# Patient Record
Sex: Male | Born: 1980
Health system: Southern US, Community
[De-identification: ages and names within clinical notes are randomized; demographics above are authoritative.]

## PROBLEM LIST (undated history)

## (undated) DIAGNOSIS — Z789 Other specified health status: Secondary | ICD-10-CM

## (undated) HISTORY — DX: Other specified health status: Z78.9

## (undated) HISTORY — PX: NO PAST SURGERIES: SHX2092

---
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
## 2017-03-26 DIAGNOSIS — Z0000 Encounter for general adult medical examination without abnormal findings: Secondary | ICD-10-CM | POA: Diagnosis not present

## 2017-04-18 DIAGNOSIS — D485 Neoplasm of uncertain behavior of skin: Secondary | ICD-10-CM | POA: Diagnosis not present

## 2017-04-18 DIAGNOSIS — D1801 Hemangioma of skin and subcutaneous tissue: Secondary | ICD-10-CM | POA: Diagnosis not present

## 2017-04-18 DIAGNOSIS — D2261 Melanocytic nevi of right upper limb, including shoulder: Secondary | ICD-10-CM | POA: Diagnosis not present

## 2017-04-18 DIAGNOSIS — B078 Other viral warts: Secondary | ICD-10-CM | POA: Diagnosis not present

## 2017-04-18 DIAGNOSIS — D225 Melanocytic nevi of trunk: Secondary | ICD-10-CM | POA: Diagnosis not present

## 2017-04-18 DIAGNOSIS — D2262 Melanocytic nevi of left upper limb, including shoulder: Secondary | ICD-10-CM | POA: Diagnosis not present

## 2017-05-14 DIAGNOSIS — D485 Neoplasm of uncertain behavior of skin: Secondary | ICD-10-CM | POA: Diagnosis not present

## 2017-05-14 DIAGNOSIS — B078 Other viral warts: Secondary | ICD-10-CM | POA: Diagnosis not present

## 2017-05-14 DIAGNOSIS — L988 Other specified disorders of the skin and subcutaneous tissue: Secondary | ICD-10-CM | POA: Diagnosis not present

## 2018-04-24 DIAGNOSIS — D1801 Hemangioma of skin and subcutaneous tissue: Secondary | ICD-10-CM | POA: Diagnosis not present

## 2018-04-24 DIAGNOSIS — D225 Melanocytic nevi of trunk: Secondary | ICD-10-CM | POA: Diagnosis not present

## 2018-04-24 DIAGNOSIS — D2262 Melanocytic nevi of left upper limb, including shoulder: Secondary | ICD-10-CM | POA: Diagnosis not present

## 2018-04-24 DIAGNOSIS — D2261 Melanocytic nevi of right upper limb, including shoulder: Secondary | ICD-10-CM | POA: Diagnosis not present

## 2018-11-19 DIAGNOSIS — G4700 Insomnia, unspecified: Secondary | ICD-10-CM | POA: Diagnosis not present

## 2018-11-19 DIAGNOSIS — M25561 Pain in right knee: Secondary | ICD-10-CM | POA: Diagnosis not present

## 2018-11-19 DIAGNOSIS — R5383 Other fatigue: Secondary | ICD-10-CM | POA: Diagnosis not present

## 2018-12-15 DIAGNOSIS — R5383 Other fatigue: Secondary | ICD-10-CM | POA: Diagnosis not present

## 2018-12-15 DIAGNOSIS — M25561 Pain in right knee: Secondary | ICD-10-CM | POA: Diagnosis not present

## 2018-12-15 DIAGNOSIS — E559 Vitamin D deficiency, unspecified: Secondary | ICD-10-CM | POA: Diagnosis not present

## 2018-12-15 DIAGNOSIS — R7989 Other specified abnormal findings of blood chemistry: Secondary | ICD-10-CM | POA: Diagnosis not present

## 2018-12-31 ENCOUNTER — Encounter: Payer: Self-pay | Admitting: Sports Medicine

## 2018-12-31 ENCOUNTER — Ambulatory Visit (INDEPENDENT_AMBULATORY_CARE_PROVIDER_SITE_OTHER): Payer: BLUE CROSS/BLUE SHIELD | Admitting: Sports Medicine

## 2018-12-31 ENCOUNTER — Ambulatory Visit (INDEPENDENT_AMBULATORY_CARE_PROVIDER_SITE_OTHER): Payer: BLUE CROSS/BLUE SHIELD

## 2018-12-31 DIAGNOSIS — M25562 Pain in left knee: Secondary | ICD-10-CM

## 2018-12-31 DIAGNOSIS — M2241 Chondromalacia patellae, right knee: Secondary | ICD-10-CM | POA: Diagnosis not present

## 2018-12-31 DIAGNOSIS — M25561 Pain in right knee: Secondary | ICD-10-CM | POA: Diagnosis not present

## 2018-12-31 NOTE — Progress Notes (Signed)
Subjective:    I'm seeing this patient as a consultation for: Dr. 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  He was unable to finish the race.  It tended and seemed to lock up.  Since then he has had persistent discomfort, moderate, persistent, localized on the anterior right knee, and typically gets pain at about 3 miles.  It has been so pervasive he has not really been able to run anymore.  I reviewed the past medical history, family history, social history, surgical history, and allergies today and no changes were needed.  Please see the problem list section below in epic for further details.  Past Medical History: Past Medical History:  Diagnosis Date  . No pertinent past medical history    Past Surgical History: Past Surgical History:  Procedure Laterality Date  . NO PAST SURGERIES     Social History: Social History   Socioeconomic History  . Marital status: Married    Spouse name: Not on file  . Number of children: Not on file  . Years of education: Not on file  . Highest education level: Not on file  Occupational History  . Not on file  Social Needs  . Financial resource strain: Not on file  . Food insecurity:    Worry: Not on file    Inability: Not on file  . Transportation needs:    Medical: Not on file    Non-medical: Not on file  Tobacco Use  . Smoking status: Never Smoker  . Smokeless tobacco: Never Used  Substance and Sexual Activity  . Alcohol use: Yes  . Drug use: Never  . Sexual activity: Yes  Lifestyle  . Physical activity:    Days per week: Not on file    Minutes per session: Not on file  . Stress: Not on file  Relationships  . Social connections:    Talks on phone: Not on file    Gets together: Not on file    Attends religious service: Not on file    Active member of club or  organization: Not on file    Attends meetings of clubs or organizations: Not on file    Relationship status: Not on file  Other Topics Concern  . Not on file  Social History Narrative  . Not on file   Family History: No family history on file. Allergies: No Known Allergies Medications: See med rec.  Review of Systems: No headache, visual changes, nausea, vomiting, diarrhea, constipation, dizziness, abdominal pain, skin rash, fevers, chills, night sweats, weight loss, swollen lymph nodes, body aches, joint swelling, muscle aches, chest pain, shortness of breath, mood changes, visual or auditory hallucinations.   Objective:   General: Well Developed, well nourished, and in no acute distress.  Neuro:  Extra-ocular muscles intact, able to move all 4 extremities, sensation grossly intact.  Deep tendon reflexes tested were normal. Psych: Alert and oriented, mood congruent with affect. ENT:  Ears and nose appear unremarkable.  Hearing grossly normal. Neck: Unremarkable overall appearance, trachea midline.  No visible thyroid enlargement. Eyes: Conjunctivae and lids appear unremarkable.  Pupils equal and round. Skin: Warm and dry, no rashes noted.  Cardiovascular: Pulses palpable, no extremity edema. Right knee: Normal to inspection with no erythema or effusion or obvious bony abnormalities. Palpation normal with no warmth or joint line  tenderness or patellar tenderness or condyle tenderness. ROM normal in flexion and extension and lower leg rotation. Ligaments with solid consistent endpoints including ACL, PCL, LCL, MCL. Negative Mcmurray's and provocative meniscal tests. Painful patellar compression with positive crepitus. Patellar and quadriceps tendons unremarkable. Hamstring and quadriceps strength is normal.  Impression and Recommendations:   This case required medical decision making of moderate complexity.  Chondromalacia of right patellofemoral joint Ultra marathon  runner. Mechanical symptoms, grinding. Suspected focal area of patellofemoral chondral thinning. Symptoms have been present for months in spite of conservative measures. Because he is having mechanical symptoms we are going to proceed with MRI after x-ray. Return to go over MRI results. ___________________________________________ Ihor Austinhomas J. Benjamin Stainhekkekandam, M.D., ABFM., CAQSM. Primary Care and Sports Medicine [facility]  Adjunct Professor of Family Medicine  [employer]

## 2018-12-31 NOTE — Assessment & Plan Note (Addendum)
Ultra marathon runner. Mechanical symptoms, grinding. Suspected focal area of patellofemoral chondral thinning. Symptoms have been present for months in spite of conservative measures. Because he is having mechanical symptoms we are going to proceed with MRI after x-ray. Return to go over MRI results.

## 2019-01-11 ENCOUNTER — Ambulatory Visit (INDEPENDENT_AMBULATORY_CARE_PROVIDER_SITE_OTHER): Payer: BLUE CROSS/BLUE SHIELD

## 2019-01-11 DIAGNOSIS — M2241 Chondromalacia patellae, right knee: Secondary | ICD-10-CM | POA: Diagnosis not present

## 2019-01-11 DIAGNOSIS — M25561 Pain in right knee: Secondary | ICD-10-CM | POA: Diagnosis not present

## 2019-01-14 ENCOUNTER — Ambulatory Visit (INDEPENDENT_AMBULATORY_CARE_PROVIDER_SITE_OTHER): Payer: BLUE CROSS/BLUE SHIELD | Admitting: Sports Medicine

## 2019-01-14 DIAGNOSIS — M2241 Chondromalacia patellae, right knee: Secondary | ICD-10-CM

## 2019-01-14 MED ORDER — MELOXICAM 15 MG PO TABS: ORAL_TABLET | ORAL | 3 refills | Status: AC

## 2019-01-14 NOTE — Progress Notes (Signed)
Subjective:    CC: MRI results  HPI: This is a pleasant 38 year old male, he is an ultra marathon runner, has been having some knee pain.  He is here to go over the results.  I reviewed the past medical history, family history, social history, surgical history, and allergies today and no changes were needed.  Please see the problem list section below in epic for further details.  Past Medical History: Past Medical History:  Diagnosis Date  . No pertinent past medical history    Past Surgical History: Past Surgical History:  Procedure Laterality Date  . NO PAST SURGERIES     Social History: Social History   Socioeconomic History  . Marital status: Married    Spouse name: Not on file  . Number of children: Not on file  . Years of education: Not on file  . Highest education level: Not on file  Occupational History  . Not on file  Social Needs  . Financial resource strain: Not on file  . Food insecurity:    Worry: Not on file    Inability: Not on file  . Transportation needs:    Medical: Not on file    Non-medical: Not on file  Tobacco Use  . Smoking status: Never Smoker  . Smokeless tobacco: Never Used  Substance and Sexual Activity  . Alcohol use: Yes  . Drug use: Never  . Sexual activity: Yes  Lifestyle  . Physical activity:    Days per week: Not on file    Minutes per session: Not on file  . Stress: Not on file  Relationships  . Social connections:    Talks on phone: Not on file    Gets together: Not on file    Attends religious service: Not on file    Active member of club or organization: Not on file    Attends meetings of clubs or organizations: Not on file    Relationship status: Not on file  Other Topics Concern  . Not on file  Social History Narrative  . Not on file   Family History: No family history on file. Allergies: No Known Allergies Medications: See med rec.  Review of Systems: No fevers, chills, night sweats, weight loss, chest  pain, or shortness of breath.   Objective:    General: Well Developed, well nourished, and in no acute distress.  Neuro: Alert and oriented x3, extra-ocular muscles intact, sensation grossly intact.  HEENT: Normocephalic, atraumatic, pupils equal round reactive to light, neck supple, no masses, no lymphadenopathy, thyroid nonpalpable.  Skin: Warm and dry, no rashes. Cardiac: Regular rate and rhythm, no murmurs rubs or gallops, no lower extremity edema.  Respiratory: Clear to auscultation bilaterally. Not using accessory muscles, speaking in full sentences.  MRI personally reviewed with the patient, and shows Hoffa's fat pad edema as well as patellofemoral chondromalacia.  Impression and Recommendations:    Chondromalacia of right patellofemoral joint MRI confirms a superior Hoffa's fat pad edema as well as patellofemoral chondromalacia without focal cartilage defect. We are going to treat this conservatively to start out with, formal physical therapy, meloxicam for 2 weeks, return for custom molded orthotics. If insufficient relief after at least 6 weeks of therapy we will consider Visco supplementation, I am going to go ahead and get him approved even though we may not need to use it. Ultimate goal will be to get him back to marathons and ultra marathons.  I spent 25 minutes with this patient, greater than 50%  was face-to-face time counseling regarding the above diagnoses, specifically discussing the pathophysiology of patellofemoral pain syndrome as well as going over MRI studies. ___________________________________________ Ihor Austin. Benjamin Stain, M.D., ABFM., CAQSM. Primary Care and Sports Medicine [facility] [facility]  Adjunct Professor of Family Medicine  [employer]

## 2019-01-14 NOTE — Assessment & Plan Note (Signed)
MRI confirms a superior Hoffa's fat pad edema as well as patellofemoral chondromalacia without focal cartilage defect. We are going to treat this conservatively to start out with, formal physical therapy, meloxicam for 2 weeks, return for custom molded orthotics. If insufficient relief after at least 6 weeks of therapy we will consider Visco supplementation, I am going to go ahead and get him approved even though we may not need to use it. Ultimate goal will be to get him back to marathons and ultra marathons.

## 2019-01-21 ENCOUNTER — Ambulatory Visit: Payer: BLUE CROSS/BLUE SHIELD | Attending: Sports Medicine | Admitting: Physical Therapy

## 2019-01-21 ENCOUNTER — Encounter: Payer: Self-pay | Admitting: Physical Therapy

## 2019-01-21 ENCOUNTER — Other Ambulatory Visit: Payer: Self-pay

## 2019-01-21 DIAGNOSIS — G8929 Other chronic pain: Secondary | ICD-10-CM | POA: Diagnosis present

## 2019-01-21 DIAGNOSIS — M25561 Pain in right knee: Secondary | ICD-10-CM | POA: Insufficient documentation

## 2019-01-21 DIAGNOSIS — M6281 Muscle weakness (generalized): Secondary | ICD-10-CM | POA: Insufficient documentation

## 2019-01-21 NOTE — Therapy (Signed)
[facility] [address], [address] [place], [place], [address] Phone: [phone]   Fax:  [phone]  Physical Therapy Evaluation  Patient Details  Name: Brent Clark MRN: [id number] Date of Birth: 10/09/1981 Referring Provider (PT): Dr Rodney Langton   Encounter Date: 01/21/2019  PT End of Session - 01/21/19 2013    Visit Number  1    Number of Visits  60    Date for PT Re-Evaluation  03/18/19    Authorization Type  [employer]    PT Start Time  1617    PT Stop Time  1655    PT Time Calculation (min)  38 min    Activity Tolerance  Patient tolerated treatment well       Past Medical History:  Diagnosis Date  . No pertinent past medical history     Past Surgical History:  Procedure Laterality Date  . NO PAST SURGERIES      There were no vitals filed for this visit.   Subjective Assessment - 01/21/19 1620    Subjective   1 year ago training for ultramarathon 7 miles in, knee locked up and couldn't run;  took a few months off;  3-5 mile range running knee will start to hurt or sprinting will hurt or if not warmed up;  lateral knee pain;  worked on ITB with foam roll which helped some.  Knee pain persisted.   I'm not running at all right now b/c of work.  No injections.      Pertinent History  good health    Diagnostic tests  MRI     Patient Stated Goals  Get back to running and not feel limited    Currently in Pain?  No/denies    Pain Score  0-No pain    Pain Location  Knee    Pain Orientation  Right    Pain Type  Chronic pain    Aggravating Factors   running         [facility] PT Assessment - 01/21/19 0001      Assessment   Medical Diagnosis  chondromalacia of right patellofemoral joint    Referring Provider (PT)  Dr Rodney Langton    Onset Date/Surgical Date  --   1  year   Next MD Visit  1 month if not better then PRP injection     Prior Therapy  no      Precautions   Precautions  None      Restrictions   Weight Bearing Restrictions  No      Balance Screen   Has the patient fallen in the past 6 months  No    Has the patient had a decrease in activity level because of a fear of falling?   No    Is the patient reluctant to leave their home because of a fear of falling?   No      Home Public house manager residence    Living Arrangements  Spouse/significant other      Prior Function   Level of Independence  Independent    Vocation  Full time employment    Leisure  ultra marathon runner;  works with Systems analyst      Observation/Other Assessments   Focus on Therapeutic Outcomes (FOTO)   to be done next visit      AROM   Right Knee Extension  0    Right Knee Flexion  140  Left Knee Extension  0    Left Knee Flexion  140      Strength   Overall Strength Comments  difficulty stabilizing/pelvic drop with right SLS;  right adduction/internal rotation with step downs     Right Hip Flexion  5/5    Right Hip Extension  5/5    Right Hip ABduction  4+/5    Left Hip Flexion  5/5    Left Hip Extension  5/5    Left Hip ABduction  5/5    Right Knee Flexion  5/5    Right Knee Extension  4+/5    Left Knee Flexion  5/5    Left Knee Extension  5/5      Flexibility   Soft Tissue Assessment /Muscle Length  yes    Hamstrings  bil 70 degrees    Quadriceps  mild right hip flexor tightness         Tender points right ITB and lateral quads       Objective measurements completed on examination: See above findings.              PT Education - 01/21/19 1654    Education Details   Access Code: [email or web address]  clams with green band; standing stand tall;  3 inch step downs     Person(s) Educated  Patient    Methods  Explanation;Demonstration;Handout    Comprehension  Returned demonstration;Verbalized understanding       PT Short Term Goals - 01/21/19 2024      PT SHORT TERM GOAL #1   Title  Patient will demonstrate awareness of  patellofemoral alignment during exercises and ADLs    Time  4    Period  Weeks    Status  New    Target Date  02/18/19      PT SHORT TERM GOAL #2   Title  The patient will report a 50% reduction in lateral knee pain with running 3 miles    Time  4    Period  Weeks    Status  New        PT Long Term Goals - 01/21/19 2026      PT LONG TERM GOAL #1   Title  The patient will be independent in safe self progression of HEP     Time  8    Period  Weeks    Status  New    Target Date  03/18/19      PT LONG TERM GOAL #2   Title  The patient will report a 60% improvement in right lateral knee pain with running > 5 miles    Time  8    Period  Weeks    Status  New      PT LONG TERM GOAL #3   Title  The patient will have 5-/5 gluteal and quad strength needed for patellofemoral alignment with running    Time  8    Period  Weeks    Status  New      PT LONG TERM GOAL #4   Title  FOTO functional outcome score improved to predicted score     Time  8    Period  Weeks    Status  New             Plan - 01/21/19 1658    Clinical Impression Statement  The patient complains of a 1 year history of right lateral knee pain while training for an ultramarathon.  He  reports in his initial incident his knee locked up and he was unable to continue.  He pain continues to occur at the 3-5 mile mark during his runs despite taking off several months off.  He had an MRI shows mild chondromalacia of the patella, mild fat pad edema.  No meniscal or ligamentous injury of right knee.  His knee ROM is WNLs.  Decreased bil HS lengths and mild right hip flexor length.  Tender points lateral knee at lateral quad insertion site and along ITB.  Right gluteal weakness apparent with SLS and hip/knee adduction and internal rotation (medial knee collapse) noted with 6 inch step down test.  He would benefit from PT to address these deficits.      History and Personal Factors relevant to plan of care:  good health;   good psychosocial support    Clinical Presentation  Stable    Clinical Decision Making  Low    Rehab Potential  Good    PT Frequency  1x / week    PT Duration  8 weeks    PT Treatment/Interventions  ADLs/Self Care Home Management;Iontophoresis 4mg /ml Dexamethasone;Cryotherapy;Electrical Stimulation;Ultrasound;Moist Heat;Therapeutic exercise;Patient/family education;Neuromuscular re-education;Manual techniques;Dry needling;Taping    PT Next Visit Plan  Do FOTO missed on eval;  Add HS stretch to HEP;  check Thomas test;  check plank/core strength;  add band to sidestepping;  recheck step downs;  very interested DN ITB/lateral quads;  teach self deep knee retinaculum stretch;  ionto as a last resort     PT Home Exercise Plan   Access Code: [email or web address]     Consulted and Agree with Plan of Care  Patient       Patient will benefit from skilled therapeutic intervention in order to improve the following deficits and impairments:  Pain, Decreased strength, Impaired perceived functional ability  Visit Diagnosis: Chronic pain of right knee - Plan: PT plan of care cert/re-cert  Muscle weakness (generalized) - Plan: PT plan of care cert/re-cert     Problem List Patient Active Problem List   Diagnosis Date Noted  . Chondromalacia of right patellofemoral joint 12/31/2018   [name], PT 01/21/19 8:32 PM Phone: [phone] Fax: [phone] [name] 01/21/2019, 8:31 PM  [facility] [address], [address] [place], [place], [address] Phone: [phone]   Fax:  [phone]  Name: Brent Clark MRN: [id number] Date of Birth: [birth year]

## 2019-01-21 NOTE — Patient Instructions (Signed)
Access Code: [email or web address]  URL: [email or web address]  Date: 01/21/2019  Prepared by: [name]   Exercises  Clamshell with Resistance - 10 reps - 2-3 sets - 1x daily - 7x weekly  Standing Gluteal Sets - 10 reps - 3 sets - 1x daily - 7x weekly  Forward Step Down - 10 reps - 1-2 sets - 1x daily - 7x weekly

## 2019-01-27 ENCOUNTER — Ambulatory Visit (INDEPENDENT_AMBULATORY_CARE_PROVIDER_SITE_OTHER): Payer: BLUE CROSS/BLUE SHIELD | Admitting: Sports Medicine

## 2019-01-27 DIAGNOSIS — M2241 Chondromalacia patellae, right knee: Secondary | ICD-10-CM | POA: Diagnosis not present

## 2019-01-27 NOTE — Progress Notes (Signed)
    Patient was fitted for a : standard, cushioned, semi-rigid orthotic. The orthotic was heated and afterward the patient stood on the orthotic blank positioned on the orthotic stand. The patient was positioned in subtalar neutral position and 10 degrees of ankle dorsiflexion in a weight bearing stance. After completion of molding, a stable base was applied to the orthotic blank. The blank was ground to a stable position for weight bearing. Size: 10 Base: White Doctor, hospital and Padding: None The patient ambulated these, and they were very comfortable.  I spent 40 minutes with this patient, greater than 50% was face-to-face time counseling regarding the below diagnosis specifically diagnosis, treatment options and prognosis of patellofemoral chondromalacia..  ___________________________________________ Ihor Austin. Benjamin Stain, M.D., ABFM., CAQSM. Primary Care and Sports Medicine [place] [facility]  Adjunct Instructor of Family Medicine  [employer]

## 2019-01-27 NOTE — Assessment & Plan Note (Signed)
MRI confirms Hoffa's fat pad edema as well as patellofemoral chondromalacia without focal cartilage defect. Continue with formal PT, custom molded orthotics today. If insufficient relief after 6 weeks we will consider Visco supplementation. We are going to get him approved. Ultimate goal is to get back to marathons and ultra marathons.

## 2019-01-29 ENCOUNTER — Ambulatory Visit: Payer: BLUE CROSS/BLUE SHIELD | Admitting: Physical Therapy

## 2019-01-29 DIAGNOSIS — M6281 Muscle weakness (generalized): Secondary | ICD-10-CM

## 2019-01-29 DIAGNOSIS — M25561 Pain in right knee: Principal | ICD-10-CM

## 2019-01-29 DIAGNOSIS — G8929 Other chronic pain: Secondary | ICD-10-CM

## 2019-01-29 NOTE — Patient Instructions (Signed)
Access Code: [email or web address]  URL: [email or web address]  Date: 01/29/2019  Prepared by: [name]   Exercises  Clamshell with Resistance - 10 reps - 2-3 sets - 1x daily - 7x weekly  Standing Gluteal Sets - 10 reps - 3 sets - 1x daily - 7x weekly  Forward Step Down - 10 reps - 1-2 sets - 1x daily - 7x weekly  Long Sitting Medial Patellar Glide - 10 reps - 1 sets - 1x daily - 7x weekly  Standing ITB Stretch - 3 reps - 1 sets - 20 hold - 1x daily - 7x weekly  Supine Hamstring Stretch with Strap - 3 reps - 1 sets - 20 hold - 1x daily - 7x weekly  Side Stepping with Resistance at Thighs - 10 reps - 1 sets - 1x daily - 7x weekly  Erby Pian on Table - 3 reps - 1 sets - 20 hold - 1x daily - 7x weekly     Trigger Point Dry Needling  . What is Trigger Point Dry Needling (DN)? o DN is a physical therapy technique used to treat muscle pain and dysfunction. Specifically, DN helps deactivate muscle trigger points (muscle knots).  o A thin filiform needle is used to penetrate the skin and stimulate the underlying trigger point. The goal is for a local twitch response (LTR) to occur and for the trigger point to relax. No medication of any kind is injected during the procedure.   . What Does Trigger Point Dry Needling Feel Like?  o The procedure feels different for each individual patient. Some patients report that they do not actually feel the needle enter the skin and overall the process is not painful. Very mild bleeding may occur. However, many patients feel a deep cramping in the muscle in which the needle was inserted. This is the local twitch response.   Marland Kitchen How Will I feel after the treatment? o Soreness is normal, and the onset of soreness may not occur for a few hours. Typically this soreness does not last longer than two days.  o Bruising is uncommon, however; ice can be used to decrease any possible bruising.  o In rare cases feeling tired or nauseous after the treatment is  normal. In addition, your symptoms may get worse before they get better, this period will typically not last longer than 24 hours.   . What Can I do After My Treatment? o Increase your hydration by drinking more water for the next 24 hours. o You may place ice or heat on the areas treated that have become sore, however, do not use heat on inflamed or bruised areas. Heat often brings more relief post needling. o You can continue your regular activities, but vigorous activity is not recommended initially after the treatment for 24 hours. o DN is best combined with other physical therapy such as strengthening, stretching, and other therapies.     [name] PT [facility] [address], [address] [place], [place] [address] Phone # [phone] Fax [phone]

## 2019-01-29 NOTE — Therapy (Signed)
[facility] [address] [place], [place], [address] Phone: [phone]   Fax:  [phone]  Physical Therapy Treatment  Patient Details  Name: Brent Clark MRN: [id number] Date of Birth: 28-Aug-1981 Referring Provider (PT): Dr Rodney Langton   Encounter Date: 01/29/2019  PT End of Session - 01/29/19 1644    Visit Number  2    Number of Visits  60    Date for PT Re-Evaluation  03/18/19    Authorization Type  BCBS    PT Start Time  0845    PT Stop Time  0930    PT Time Calculation (min)  45 min    Activity Tolerance  Patient tolerated treatment well       Past Medical History:  Diagnosis Date  . No pertinent past medical history     Past Surgical History:  Procedure Laterality Date  . NO PAST SURGERIES      There were no vitals filed for this visit.  Subjective Assessment - 01/29/19 0852    Subjective  Got custom orthotics yesterday.  No pain this week.  I haven't run b/c really busy at work right now.      Currently in Pain?  No/denies    Pain Score  0-No pain                       [facility] Adult PT Treatment/Exercise - 01/29/19 0001      Knee/Hip Exercises: Stretches   Active Hamstring Stretch  Right;Left;3 reps;20 seconds    Active Hamstring Stretch Limitations  with strap in supine     Hip Flexor Stretch  Right;2 reps;20 seconds    Hip Flexor Stretch Limitations  Thomas test position     ITB Stretch  Right;3 reps;20 seconds    ITB Stretch Limitations  standing      Knee/Hip Exercises: Aerobic   Nustep  L3 4 min       Knee/Hip Exercises: Standing   Lateral Step Up Limitations  lateral 3 point step downs 15x    Step Down  Left;15 reps;Hand Hold: 0;Step Height: 4"    Other Standing Knee Exercises  4 inch step forward and then back over the step 15x     Other Standing Knee Exercises  green band around thighs crouch side step, monster walk and tight rope walk       Knee/Hip Exercises: Sidelying   Clams  green band and with ball between feet 15x      Moist Heat Therapy   Number Minutes Moist Heat  5 Minutes    Moist Heat Location  Knee      Manual Therapy   Manual therapy comments  deep knee retinaculum stretch 7x    Soft tissue mobilization  right ITB, lateral HS and quads manually and with Addaday instrument assist        Trigger Point Dry Needling - 01/29/19 1641    Consent Given?  Yes    Education Handout Provided  Yes    Muscles Treated Lower Body  Tensor fascia lata;Hamstring    Tensor Fascia Lata Response  Twitch response elicited;Palpable increased muscle length    Hamstring Response  Twitch response elicited;Palpable increased muscle length           PT Education - 01/29/19 1644    Education Details   Access Code: [email or web address]     Person(s) Educated  Patient    Methods  Explanation;Demonstration;Handout  Comprehension  Returned demonstration;Verbalized understanding       PT Short Term Goals - 01/21/19 2024      PT SHORT TERM GOAL #1   Title  Patient will demonstrate awareness of patellofemoral alignment during exercises and ADLs    Time  4    Period  Weeks    Status  New    Target Date  02/18/19      PT SHORT TERM GOAL #2   Title  The patient will report a 50% reduction in lateral knee pain with running 3 miles    Time  4    Period  Weeks    Status  New        PT Long Term Goals - 01/21/19 2026      PT LONG TERM GOAL #1   Title  The patient will be independent in safe self progression of HEP     Time  8    Period  Weeks    Status  New    Target Date  03/18/19      PT LONG TERM GOAL #2   Title  The patient will report a 60% improvement in right lateral knee pain with running > 5 miles    Time  8    Period  Weeks    Status  New      PT LONG TERM GOAL #3   Title  The patient will have 5-/5 gluteal and quad strength needed for patellofemoral alignment with running    Time  8    Period  Weeks    Status   New      PT LONG TERM GOAL #4   Title  FOTO functional outcome score improved to predicted score     Time  8    Period  Weeks    Status  New            Plan - 01/29/19 0930    Clinical Impression Statement  The patient has improved motor control with 4 inch step downs.  He does report muscular fatigue in quads and gluteals with exercise.  Tender points along lateral HS attachment at the knee.  Patient is very receptive to DN.  Therapist closely monitoring response with all interventions.      Rehab Potential  Good    PT Frequency  1x / week    PT Duration  8 weeks    PT Treatment/Interventions  ADLs/Self Care Home Management;Iontophoresis 4mg /ml Dexamethasone;Cryotherapy;Electrical Stimulation;Ultrasound;Moist Heat;Therapeutic exercise;Patient/family education;Neuromuscular re-education;Manual techniques;Dry needling;Taping    PT Next Visit Plan   check plank/core strength; progressive quad and gluteal strengthening;  assess response to  DN ITB/lateral quads;   Elliptical;   ionto as a last resort     PT Home Exercise Plan   Access Code: [email or web address]        Patient will benefit from skilled therapeutic intervention in order to improve the following deficits and impairments:  Pain, Decreased strength, Impaired perceived functional ability  Visit Diagnosis: Chronic pain of right knee  Muscle weakness (generalized)     Problem List Patient Active Problem List   Diagnosis Date Noted  . Chondromalacia of right patellofemoral joint 12/31/2018   [name], PT 01/29/19 4:54 PM Phone: [phone] Fax: [phone]  [name] 01/29/2019, 4:54 PM  [facility] [address], [address] [place], [place], [address] Phone: [phone]   Fax:  [phone]  Name: Brent Clark MRN: [id number] Date of Birth: 1981-12-26

## 2019-02-02 ENCOUNTER — Encounter: Payer: Self-pay | Admitting: Physical Therapy

## 2019-02-02 ENCOUNTER — Ambulatory Visit: Payer: BLUE CROSS/BLUE SHIELD | Attending: Sports Medicine | Admitting: Physical Therapy

## 2019-02-02 DIAGNOSIS — M25561 Pain in right knee: Secondary | ICD-10-CM | POA: Diagnosis not present

## 2019-02-02 DIAGNOSIS — M6281 Muscle weakness (generalized): Secondary | ICD-10-CM | POA: Diagnosis present

## 2019-02-02 DIAGNOSIS — G8929 Other chronic pain: Secondary | ICD-10-CM | POA: Diagnosis present

## 2019-02-02 NOTE — Therapy (Signed)
[facility] [address] [place], [place], [address] Phone: [phone]   Fax:  [phone]  Physical Therapy Treatment  Patient Details  Name: Brent Clark MRN: [id number] Date of Birth: 1981-10-11 Referring Provider (PT): Dr Rodney Langton   Encounter Date: 02/02/2019  PT End of Session - 02/02/19 1659    Visit Number  3    Number of Visits  60    Date for PT Re-Evaluation  03/18/19    Authorization Type  BCBS    PT Start Time  1533    PT Stop Time  1611    PT Time Calculation (min)  38 min    Activity Tolerance  Patient tolerated treatment well       Past Medical History:  Diagnosis Date  . No pertinent past medical history     Past Surgical History:  Procedure Laterality Date  . NO PAST SURGERIES      There were no vitals filed for this visit.  Subjective Assessment - 02/02/19 1533    Subjective  A little sore Saturday but better by Sunday.  Haven't run b/c of work.      Currently in Pain?  No/denies    Pain Score  0-No pain    Pain Location  Knee    Pain Orientation  Right                       [facility] Adult PT Treatment/Exercise - 02/02/19 0001      Knee/Hip Exercises: Stretches   Active Hamstring Stretch  Right;5 reps    Active Hamstring Stretch Limitations  on 2nd step with bias internal rotation     ITB Stretch  Right;3 reps;20 seconds    ITB Stretch Limitations  standing on 2nd step with overhead reach       Knee/Hip Exercises: Aerobic   Elliptical  L4 5 min       Knee/Hip Exercises: Machines for Strengthening   Cybex Leg Press  90# and 110# 50 reps right leg only       Knee/Hip Exercises: Standing   Lateral Step Up  Right;15 reps;Step Height: 6"    Lateral Step Up Limitations  with opposite leg hip abduction     Forward Step Up  Right;Hand Hold: 0;Step Height: 6"    Forward Step Up Limitations  left foot touch to chair     Step Down  Left;15 reps;Hand  Hold: 0;Step Height: 6"    Lunge Walking - Round Trips  2 laps    SLS  on right with blue band     SLS with Vectors  red band on left 4 ways 15x each right/left     Other Standing Knee Exercises  windmills right/left 15x each     Other Standing Knee Exercises  dynamic warm ups: high step, SLR walk, butt kicks, heel walk, toe walk, skipping               PT Short Term Goals - 01/21/19 2024      PT SHORT TERM GOAL #1   Title  Patient will demonstrate awareness of patellofemoral alignment during exercises and ADLs    Time  4    Period  Weeks    Status  New    Target Date  02/18/19      PT SHORT TERM GOAL #2   Title  The patient will report a 50% reduction in lateral knee pain with running  3 miles    Time  4    Period  Weeks    Status  New        PT Long Term Goals - 01/21/19 2026      PT LONG TERM GOAL #1   Title  The patient will be independent in safe self progression of HEP     Time  8    Period  Weeks    Status  New    Target Date  03/18/19      PT LONG TERM GOAL #2   Title  The patient will report a 60% improvement in right lateral knee pain with running > 5 miles    Time  8    Period  Weeks    Status  New      PT LONG TERM GOAL #3   Title  The patient will have 5-/5 gluteal and quad strength needed for patellofemoral alignment with running    Time  8    Period  Weeks    Status  New      PT LONG TERM GOAL #4   Title  FOTO functional outcome score improved to predicted score     Time  8    Period  Weeks    Status  New            Plan - 02/02/19 1700    Clinical Impression Statement  The patient has improving right patellofemoral motor control with less adduction/internal rotation.  He is able to progress intensity of quad and gluteal strengthening without medial knee collapse.  Therapist closely monitoring response with all.  No pain produced.  Discussed slow return to running program.      Rehab Potential  Good    PT Frequency  1x / week     PT Duration  8 weeks    PT Treatment/Interventions  ADLs/Self Care Home Management;Iontophoresis 4mg /ml Dexamethasone;Cryotherapy;Electrical Stimulation;Ultrasound;Moist Heat;Therapeutic exercise;Patient/family education;Neuromuscular re-education;Manual techniques;Dry needling;Taping    PT Next Visit Plan   check plank/core strength; progressive quad and gluteal strengthening;    DN ITB/lateral quads;   Elliptical;   ionto as a last resort     PT Home Exercise Plan   Access Code: [email or web address]        Patient will benefit from skilled therapeutic intervention in order to improve the following deficits and impairments:  Pain, Decreased strength, Impaired perceived functional ability  Visit Diagnosis: Chronic pain of right knee  Muscle weakness (generalized)     Problem List Patient Active Problem List   Diagnosis Date Noted  . Chondromalacia of right patellofemoral joint 12/31/2018   Brent Clark, PT 02/02/19 5:03 PM Phone: [phone] Fax: [phone] Brent Clark 02/02/2019, 5:03 PM  [facility] [address], [address] [place], [place], [address] Phone: [phone]   Fax:  [phone]  Name: Brent Clark MRN: [id number] Date of Birth: August 06, 1981

## 2019-02-09 ENCOUNTER — Encounter: Payer: Self-pay | Admitting: Physical Therapy

## 2019-02-09 ENCOUNTER — Ambulatory Visit: Payer: BLUE CROSS/BLUE SHIELD | Admitting: Physical Therapy

## 2019-02-09 DIAGNOSIS — M25561 Pain in right knee: Principal | ICD-10-CM

## 2019-02-09 DIAGNOSIS — G8929 Other chronic pain: Secondary | ICD-10-CM | POA: Diagnosis not present

## 2019-02-09 DIAGNOSIS — M6281 Muscle weakness (generalized): Secondary | ICD-10-CM

## 2019-02-09 NOTE — Therapy (Signed)
[name] [address], [address] [place], [place], [address] Phone: [phone]   Fax:  [phone]  Physical Therapy Treatment  Patient Details  Name: Brent Clark MRN: [id number] Date of Birth: 03/06/1981 Referring Provider (PT): Dr Rodney Langton   Encounter Date: 02/09/2019  PT End of Session - 02/09/19 1723    Visit Number  4    Number of Visits  60    Date for PT Re-Evaluation  03/18/19    Authorization Type  BCBS    PT Start Time  1620    PT Stop Time  1700    PT Time Calculation (min)  40 min    Activity Tolerance  Patient tolerated treatment well       Past Medical History:  Diagnosis Date  . No pertinent past medical history     Past Surgical History:  Procedure Laterality Date  . NO PAST SURGERIES      There were no vitals filed for this visit.  Subjective Assessment - 02/09/19 1623    Subjective  Did 2 1/2 miles and it felt really strong.  I was aware of it but it was fun.      Currently in Pain?  No/denies    Pain Score  0-No pain    Pain Type  Chronic pain                       [facility] Adult PT Treatment/Exercise - 02/09/19 0001      Knee/Hip Exercises: Stretches   Active Hamstring Stretch  Right;5 reps    Active Hamstring Stretch Limitations  seated      Knee/Hip Exercises: Aerobic   Elliptical  L4 7 min       Knee/Hip Exercises: Standing   Lateral Step Up  Right;15 reps;Step Height: 6"    Lateral Step Up Limitations  with opposite leg hip abduction     Forward Step Up  Right;Hand Hold: 0;Step Height: 6"    Forward Step Up Limitations  toe touch to chair     SLS  with 4# plyo ball toss against rebounder     SLS with Vectors  8# weight pass hand to hand    Other Standing Knee Exercises  blue band side stepping    Other Standing Knee Exercises  bottom of foot against wall with green band clams 15x right/left       Knee/Hip Exercises: Supine   Single Leg  Bridge  Strengthening;Right;Left;15 reps      Knee/Hip Exercises: Sidelying   Other Sidelying Knee/Hip Exercises  side planks with top leg hip abduction 15x right/left    Other Sidelying Knee/Hip Exercises  side planks knees down with clam 15x right/left               PT Short Term Goals - 01/21/19 2024      PT SHORT TERM GOAL #1   Title  Patient will demonstrate awareness of patellofemoral alignment during exercises and ADLs    Time  4    Period  Weeks    Status  New    Target Date  02/18/19      PT SHORT TERM GOAL #2   Title  The patient will report a 50% reduction in lateral knee pain with running 3 miles    Time  4    Period  Weeks    Status  New        PT Long Term  Goals - 01/21/19 2026      PT LONG TERM GOAL #1   Title  The patient will be independent in safe self progression of HEP     Time  8    Period  Weeks    Status  New    Target Date  03/18/19      PT LONG TERM GOAL #2   Title  The patient will report a 60% improvement in right lateral knee pain with running > 5 miles    Time  8    Period  Weeks    Status  New      PT LONG TERM GOAL #3   Title  The patient will have 5-/5 gluteal and quad strength needed for patellofemoral alignment with running    Time  8    Period  Weeks    Status  New      PT LONG TERM GOAL #4   Title  FOTO functional outcome score improved to predicted score     Time  8    Period  Weeks    Status  New            Plan - 02/09/19 1724    Clinical Impression Statement  The patient is able to perform more advanced core, hip and quad strengthening without complaint of pain and minimal cues for patellofemoral aligment.  No complaints of pain during treatment session and during his return to running this past weekend.      Rehab Potential  Good    PT Frequency  1x / week    PT Duration  8 weeks    PT Treatment/Interventions  ADLs/Self Care Home Management;Iontophoresis 4mg /ml Dexamethasone;Cryotherapy;Electrical  Stimulation;Ultrasound;Moist Heat;Therapeutic exercise;Patient/family education;Neuromuscular re-education;Manual techniques;Dry needling;Taping    PT Next Visit Plan   core strength; progressive quad and gluteal strengthening;    DN ITB/lateral quads;   Elliptical;   ionto as a last resort     PT Home Exercise Plan   Access Code: [email or web address]        Patient will benefit from skilled therapeutic intervention in order to improve the following deficits and impairments:  Pain, Decreased strength, Impaired perceived functional ability  Visit Diagnosis: Chronic pain of right knee  Muscle weakness (generalized)     Problem List Patient Active Problem List   Diagnosis Date Noted  . Chondromalacia of right patellofemoral joint 12/31/2018   [name], PT 02/09/19 9:04 PM Phone: [phone] Fax: [phone] [name] 02/09/2019, 9:04 PM  [facility] [address] [place], [place], [address] Phone: [phone]   Fax:  [phone]  Name: Brent Clark MRN: [id number] Date of Birth: 14-Jun-1981

## 2019-02-16 ENCOUNTER — Ambulatory Visit: Payer: BLUE CROSS/BLUE SHIELD | Admitting: Physical Therapy

## 2019-02-16 ENCOUNTER — Encounter: Payer: Self-pay | Admitting: Physical Therapy

## 2019-02-16 DIAGNOSIS — G8929 Other chronic pain: Secondary | ICD-10-CM

## 2019-02-16 DIAGNOSIS — M25561 Pain in right knee: Secondary | ICD-10-CM | POA: Diagnosis not present

## 2019-02-16 DIAGNOSIS — M6281 Muscle weakness (generalized): Secondary | ICD-10-CM | POA: Diagnosis not present

## 2019-02-16 NOTE — Therapy (Addendum)
[facility] [address] [place], [place], [address] Phone: [phone]   Fax:  [phone]  Physical Therapy Treatment/Discharge Summary  Patient Details  Name: Brent Clark MRN: [id number] Date of Birth: 05/01/1981 Referring Provider (PT): Dr Aundria Mems   Encounter Date: 02/16/2019  PT End of Session - 02/16/19 1707    Visit Number  5    Number of Visits  60    Date for PT Re-Evaluation  03/18/19    Authorization Type  BCBS    PT Start Time  1615    PT Stop Time  1655    PT Time Calculation (min)  40 min    Activity Tolerance  Patient tolerated treatment well       Past Medical History:  Diagnosis Date  . No pertinent past medical history     Past Surgical History:  Procedure Laterality Date  . NO PAST SURGERIES      There were no vitals filed for this visit.  Subjective Assessment - 02/16/19 1623    Subjective  I did a lot of exercises and things in the yard, moving flagstones.  No pain.      Currently in Pain?  No/denies    Pain Score  0-No pain    Pain Orientation  Right                       [facility] Adult PT Treatment/Exercise - 02/16/19 0001      Knee/Hip Exercises: Stretches   Piriformis Stretch Limitations  dynamic with heel raise     Other Knee/Hip Stretches  dynamic hip flexor/HS/ITB stretches     Other Knee/Hip Stretches  dynamic groin stretches      Knee/Hip Exercises: Aerobic   Elliptical  L4 6 min       Knee/Hip Exercises: Standing   Lateral Step Up  Right;15 reps;Step Height: 6"    Lunge Walking - Round Trips  green band around thighs with 4 way taps 10x right/left     SLS  SLS on BOSU flat side with UE ball drop 10x     SLS with Vectors  high step carrying 8# weights     Gait Training  stand on foam green band 4 ways slow and fast 15x each right/left     Other Standing Knee Exercises  windmills right/left 15x each with combo UE reach       Knee/Hip Exercises: Supine   Other Supine Knee/Hip Exercises  Heels on box with SKTC straight and bent knee 10x each right/left    Other Supine Knee/Hip Exercises  bridge with HS curls on foam roll 15x                PT Short Term Goals - 02/16/19 1711      PT SHORT TERM GOAL #1   Title  Patient will demonstrate awareness of patellofemoral alignment during exercises and ADLs    Status  Achieved      PT SHORT TERM GOAL #2   Title  The patient will report a 50% reduction in lateral knee pain with running 3 miles    Status  Achieved        PT Long Term Goals - 01/21/19 2026      PT LONG TERM GOAL #1   Title  The patient will be independent in safe self progression of HEP     Time  8    Period  Weeks  Status  New    Target Date  03/18/19      PT LONG TERM GOAL #2   Title  The patient will report a 60% improvement in right lateral knee pain with running > 5 miles    Time  8    Period  Weeks    Status  New      PT LONG TERM GOAL #3   Title  The patient will have 5-/5 gluteal and quad strength needed for patellofemoral alignment with running    Time  8    Period  Weeks    Status  New      PT LONG TERM GOAL #4   Title  FOTO functional outcome score improved to predicted score     Time  8    Period  Weeks    Status  New            Plan - 02/16/19 1707    Clinical Impression Statement  Minimal cues only needed for patellofemoral alignment and to keep pelvis in alignment with lateral step downs.  No complaint of usual knee pain with these intermediate to advanced strengthening and proprioceptive ex's.  Discussed increasing his running mileage this week although he will be taking his kids to [facility] this weekend.  He reports he is still getting used to his new orthotics.  Continue with progressive strengthening per treatment plan.   STGS met.      Rehab Potential  Good    PT Frequency  1x / week    PT Duration  8 weeks    PT Treatment/Interventions   ADLs/Self Care Home Management;Iontophoresis 33m/ml Dexamethasone;Cryotherapy;Electrical Stimulation;Ultrasound;Moist Heat;Therapeutic exercise;Patient/family education;Neuromuscular re-education;Manual techniques;Dry needling;Taping    PT Next Visit Plan   core strength; progressive quad and gluteal strengthening;    DN ITB/lateral quads;   Elliptical;   ionto as a last resort     PT Home Exercise Plan   Access Code: [email or web address]       Patient will benefit from skilled therapeutic intervention in order to improve the following deficits and impairments:  Pain, Decreased strength, Impaired perceived functional ability  Visit Diagnosis: Chronic pain of right knee  Muscle weakness (generalized)  PHYSICAL THERAPY DISCHARGE SUMMARY  Visits from Start of Care: 5  Current functional level related to goals / functional outcomes: The patient cancelled and last visits secondary to work conflicts.  Called to follow up and patient reports he has been running up to 5 miles every other day with only mild "tightening up" in his leg.  [place] is doing well overall [place] [facility] [place]  Will discharge from PT at this time with partial goals met.     Remaining deficits: As above   Education / Equipment: HEP Plan: Patient agrees to discharge.  Patient goals were partially met. Patient is being discharged due to not returning since the last visit.  ?????          Problem List Patient Active Problem List   Diagnosis Date Noted  . Chondromalacia of right patellofemoral joint 12/31/2018   SRuben Im PT 02/16/19 5:12 PM Phone: [phone]: [phone] Singh2/18/2020, 5:12 PM  [facility] [address] [place] [address]Phone: [phone]  Fax:  [phone] Name: PEoghan BelcherMRN: [id number] of Birth: [birth year]

## 2019-02-26 ENCOUNTER — Ambulatory Visit: Payer: BLUE CROSS/BLUE SHIELD | Admitting: Physical Therapy

## 2019-03-02 ENCOUNTER — Encounter: Payer: BLUE CROSS/BLUE SHIELD | Admitting: Physical Therapy

## 2019-03-09 ENCOUNTER — Telehealth: Payer: Self-pay

## 2019-03-09 ENCOUNTER — Ambulatory Visit: Payer: BLUE CROSS/BLUE SHIELD | Admitting: Sports Medicine

## 2019-03-09 NOTE — Telephone Encounter (Signed)
Pt wanted me to send Dr T a note and let him now he called at 1:30 to cancel his 3:45 appt due to having to attend a mandatory meeting at work concerning the coronavirus..   Pt hoping to not get a no-show fee.. I advised him I would send provider a note and it would be up to him. FYI to Dr Karie Schwalbe

## 2019-03-16 ENCOUNTER — Encounter: Payer: BLUE CROSS/BLUE SHIELD | Admitting: Physical Therapy

## 2019-03-30 ENCOUNTER — Encounter: Payer: BLUE CROSS/BLUE SHIELD | Admitting: Physical Therapy

## 2019-11-14 IMAGING — MR MR KNEE*R* W/O CM
7 series · 40 of 40 positions shown · non-contrast
Comparison: None.

CLINICAL DATA: Right knee pain for 1 year.  Injured running.

EXAM:
MRI OF THE RIGHT KNEE WITHOUT CONTRAST
TECHNIQUE: Multiplanar, multisequence MR imaging of the knee was performed. No
intravenous contrast was administered.

[Series 3: T2 fat-sat · axial · 4.0mm · 0.53mm/px · z∈[-91,+54]mm · 6 of 30 slices shown (1 of 3)]
[im 1/30]
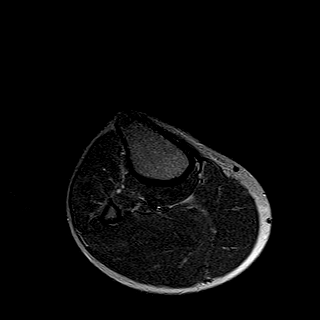
[im 6/30]
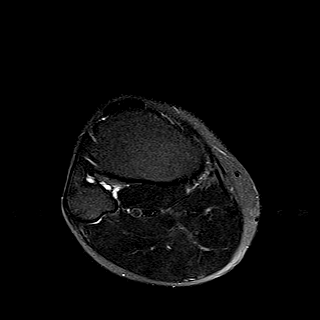
[im 12/30]
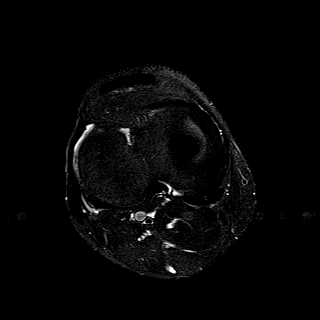
[im 18/30]
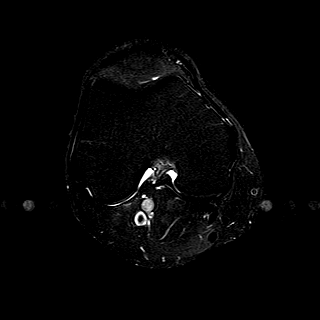
[im 24/30]
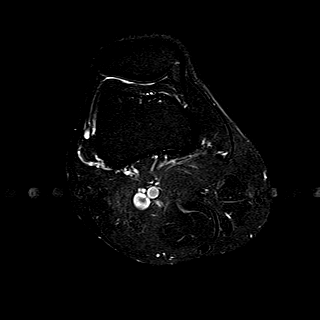
[im 30/30]
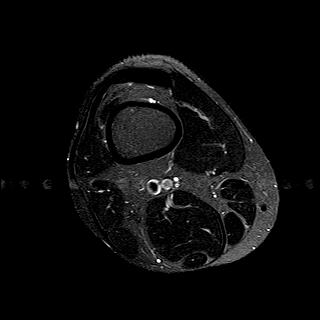

[Series 4: T1 · coronal · 4.0mm · 0.62mm/px · 6 of 27 slices shown]
[im 1/27]
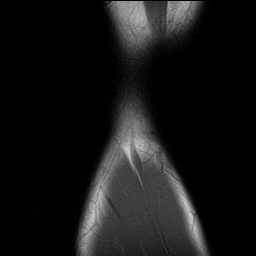
[im 6/27]
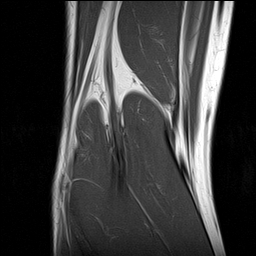
[im 11/27]
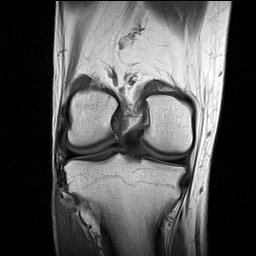
[im 16/27]
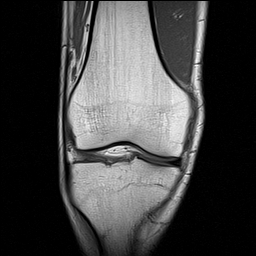
[im 21/27]
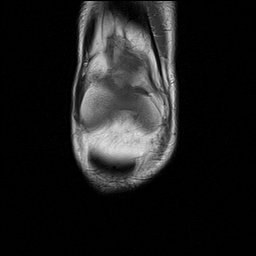
[im 27/27]
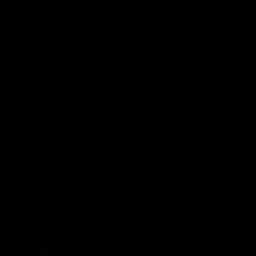

[Series 5: T2 fat-sat · coronal · 4.0mm · 0.62mm/px · 6 of 27 slices shown (2 of 3)]
[im 1/27]
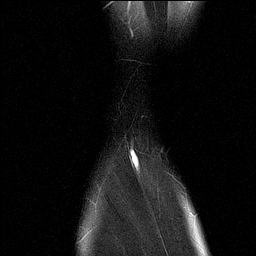
[im 6/27]
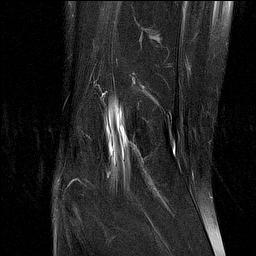
[im 11/27]
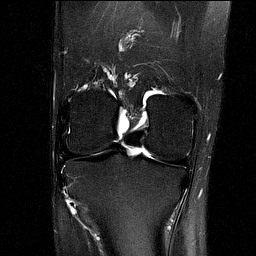
[im 16/27]
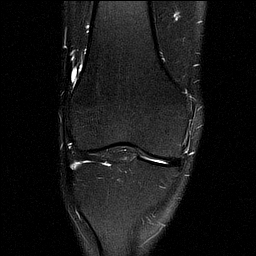
[im 21/27]
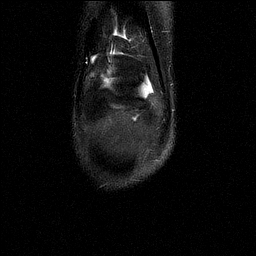
[im 27/27]
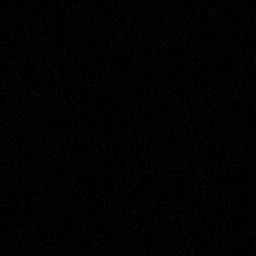

[Series 6: PD fat-sat · coronal · 4.0mm · 0.62mm/px · 6 of 27 slices shown (1 of 3)]
[im 1/27]
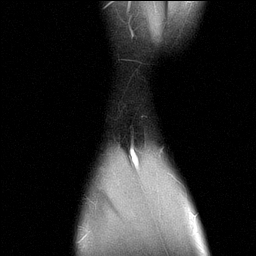
[im 6/27]
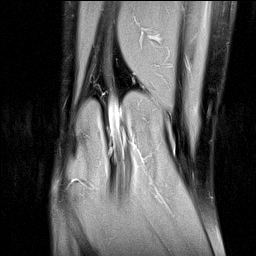
[im 11/27]
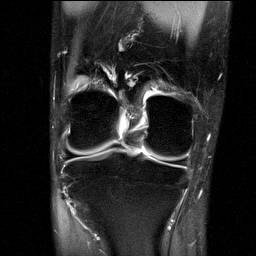
[im 16/27]
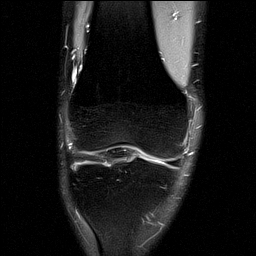
[im 21/27]
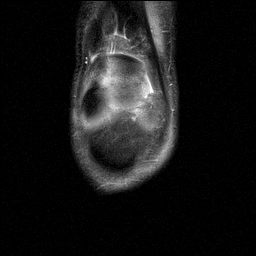
[im 27/27]
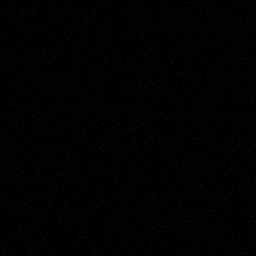

[Series 7: PD fat-sat · sagittal · 3.0mm · 0.62mm/px · 6 of 28 slices shown (2 of 3)]
[im 1/28]
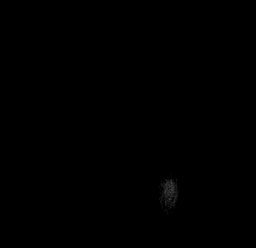
[im 6/28]
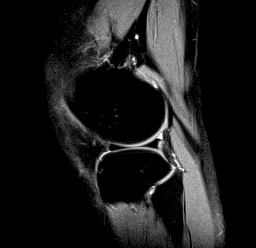
[im 11/28]
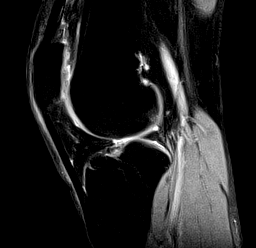
[im 17/28]
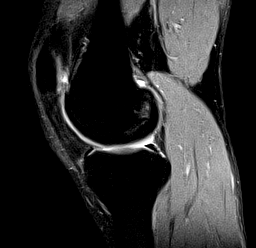
[im 22/28]
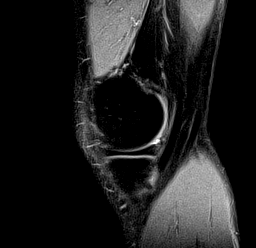
[im 28/28]
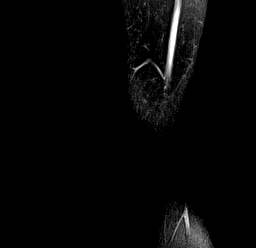

[Series 8: T2 fat-sat · sagittal · 3.0mm · 0.62mm/px · 6 of 28 slices shown (3 of 3)]
[im 1/28]
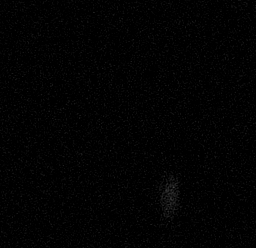
[im 6/28]
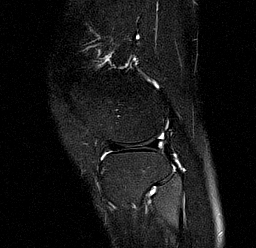
[im 11/28]
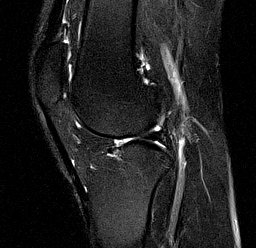
[im 17/28]
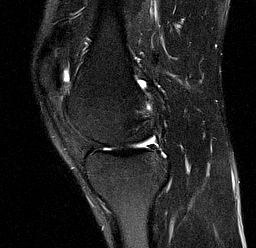
[im 22/28]
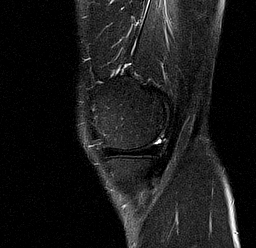
[im 28/28]
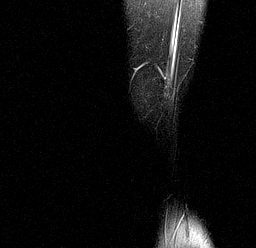

[Series 9: PD fat-sat · coronal · 2.0mm · 0.62mm/px · 4 of 19 slices shown (3 of 3)]
[im 1/19]
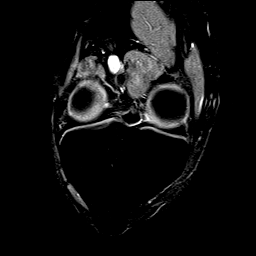
[im 7/19]
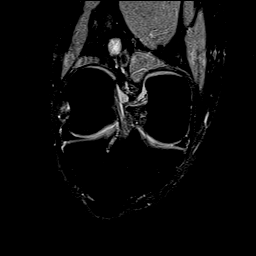
[im 13/19]
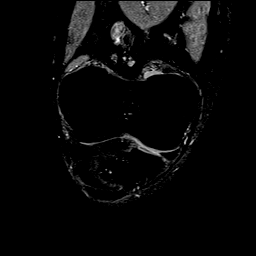
[im 19/19]
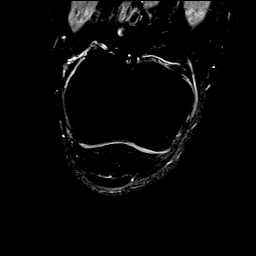

[40 of 40 positions shown; findings below may reference images not displayed]

FINDINGS: MENISCI

Medial meniscus:  Intact.

Lateral meniscus:  Intact.

LIGAMENTS

Cruciates:  Intact ACL and PCL.

Collaterals: Medial collateral ligament is intact. Lateral
collateral ligament complex is intact.

CARTILAGE

Patellofemoral:  Mild chondromalacia of the patella.

Medial: Mild partial-thickness cartilage loss of the medial
femorotibial compartment.

Lateral:  No chondral defect.

Joint: No joint effusion. Minimal edema in superolateral Hoffa's
fat. No plical thickening.

Popliteal Fossa:  Tiny Baker's cyst.  Intact popliteus tendon.

Extensor Mechanism: Intact quadriceps tendon. Intact patellar
tendon. Intact medial patellar retinaculum. Intact lateral patellar
retinaculum. Intact MPFL.

Bones:  No acute osseous abnormality.  No aggressive osseous lesion.

Other: No fluid collection or hematoma.  Muscles are normal.
IMPRESSION: 1. Minimal edema in superolateral Hoffa's fat as can be seen with
patellar tendon-lateral femoral condyle friction syndrome.
2. Mild chondromalacia of the patella without a focal chondral
defect.
3. No meniscal or ligamentous injury of right knee.

## 2020-08-05 DIAGNOSIS — W57XXXA Bitten or stung by nonvenomous insect and other nonvenomous arthropods, initial encounter: Secondary | ICD-10-CM | POA: Diagnosis not present

## 2020-08-05 DIAGNOSIS — L089 Local infection of the skin and subcutaneous tissue, unspecified: Secondary | ICD-10-CM | POA: Diagnosis not present

## 2020-08-05 DIAGNOSIS — S90562A Insect bite (nonvenomous), left ankle, initial encounter: Secondary | ICD-10-CM | POA: Diagnosis not present

## 2020-09-21 DIAGNOSIS — H0011 Chalazion right upper eyelid: Secondary | ICD-10-CM | POA: Diagnosis not present

## 2020-09-21 DIAGNOSIS — H0012 Chalazion right lower eyelid: Secondary | ICD-10-CM | POA: Diagnosis not present

## 2020-10-05 DIAGNOSIS — H0011 Chalazion right upper eyelid: Secondary | ICD-10-CM | POA: Diagnosis not present

## 2020-10-31 DIAGNOSIS — R5383 Other fatigue: Secondary | ICD-10-CM | POA: Diagnosis not present

## 2020-10-31 DIAGNOSIS — M791 Myalgia, unspecified site: Secondary | ICD-10-CM | POA: Diagnosis not present

## 2020-10-31 DIAGNOSIS — R059 Cough, unspecified: Secondary | ICD-10-CM | POA: Diagnosis not present

## 2020-10-31 DIAGNOSIS — G4700 Insomnia, unspecified: Secondary | ICD-10-CM | POA: Diagnosis not present

## 2020-10-31 DIAGNOSIS — E559 Vitamin D deficiency, unspecified: Secondary | ICD-10-CM | POA: Diagnosis not present

## 2020-10-31 DIAGNOSIS — R7989 Other specified abnormal findings of blood chemistry: Secondary | ICD-10-CM | POA: Diagnosis not present
